# Patient Record
Sex: Male | Born: 1985 | Race: White | Hispanic: No | Marital: Married | Smoking: Current every day smoker
Health system: Southern US, Community
[De-identification: ages and names within clinical notes are randomized; demographics above are authoritative.]

## PROBLEM LIST (undated history)

## (undated) DIAGNOSIS — A150 Tuberculosis of lung: Secondary | ICD-10-CM

---

## 2017-08-09 ENCOUNTER — Emergency Department (HOSPITAL_COMMUNITY)
Admission: EM | Admit: 2017-08-09 | Discharge: 2017-08-09 | Discharge status: Against Medical Advice | Payer: Self-pay | Attending: Emergency Medicine | Admitting: Emergency Medicine

## 2017-08-09 ENCOUNTER — Emergency Department (HOSPITAL_COMMUNITY): Payer: Self-pay

## 2017-08-09 ENCOUNTER — Encounter (HOSPITAL_COMMUNITY): Payer: Self-pay | Admitting: Emergency Medicine

## 2017-08-09 DIAGNOSIS — F17210 Nicotine dependence, cigarettes, uncomplicated: Secondary | ICD-10-CM | POA: Insufficient documentation

## 2017-08-09 DIAGNOSIS — M79671 Pain in right foot: Secondary | ICD-10-CM | POA: Insufficient documentation

## 2017-08-09 HISTORY — DX: Tuberculosis of lung: A15.0

## 2017-08-09 NOTE — ED Notes (Signed)
Pt seen ambulating out of department with family member.

## 2017-08-09 NOTE — ED Notes (Signed)
This RN went in to confirm with patient whether he would like to go to CT scan or not.  Wife states he said "okay" after they left.  This RN, Trinna PostAlex, PA and radiology tech all present when patient adamantly refused, continued to refuse, was made aware of the rationale for the procedure and the risks associated, and patient continues to refuse.  Patient signed AMA form willingly and then walked with family out of the department

## 2017-08-09 NOTE — ED Notes (Signed)
PA at bedside.
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
## 2017-08-09 NOTE — ED Notes (Addendum)
Pt refusing CT scan of head.  Will make PA aware
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
